# Patient Record
Sex: Female | Born: 1935 | Race: Black or African American | Hispanic: No | Marital: Single
Health system: Southern US, Community
[De-identification: ages and names within clinical notes are randomized; demographics above are authoritative.]

---

## 2004-02-26 ENCOUNTER — Emergency Department (HOSPITAL_COMMUNITY): Admission: EM | Admit: 2004-02-26 | Discharge: 2004-02-26 | Payer: Self-pay | Admitting: Emergency Medicine

## 2007-02-07 ENCOUNTER — Inpatient Hospital Stay (HOSPITAL_COMMUNITY): Admission: EM | Admit: 2007-02-07 | Discharge: 2007-02-15 | Payer: Self-pay | Admitting: Emergency Medicine

## 2007-02-10 ENCOUNTER — Ambulatory Visit: Payer: Self-pay | Admitting: Physical Medicine & Rehabilitation

## 2007-02-15 ENCOUNTER — Inpatient Hospital Stay (HOSPITAL_COMMUNITY)
Admission: RE | Admit: 2007-02-15 | Discharge: 2007-02-28 | Payer: Self-pay | Admitting: Physical Medicine & Rehabilitation

## 2008-02-18 ENCOUNTER — Emergency Department (HOSPITAL_COMMUNITY): Admission: EM | Admit: 2008-02-18 | Discharge: 2008-02-18 | Payer: Self-pay | Admitting: Emergency Medicine

## 2008-03-05 ENCOUNTER — Emergency Department (HOSPITAL_COMMUNITY): Admission: EM | Admit: 2008-03-05 | Discharge: 2008-03-05 | Payer: Self-pay | Admitting: Emergency Medicine

## 2009-02-20 ENCOUNTER — Inpatient Hospital Stay (HOSPITAL_COMMUNITY): Admission: EM | Admit: 2009-02-20 | Discharge: 2009-02-26 | Payer: Self-pay | Admitting: Internal Medicine

## 2009-02-20 ENCOUNTER — Encounter: Payer: Self-pay | Admitting: Emergency Medicine

## 2009-02-20 ENCOUNTER — Ambulatory Visit: Payer: Self-pay | Admitting: Diagnostic Radiology

## 2010-06-13 IMAGING — CR DG CHEST 2V
2 series · 2 of 2 positions shown · non-contrast
Comparison: None

CLINICAL DATA: Fever

CHEST - 2 VIEW

[w chest ap]
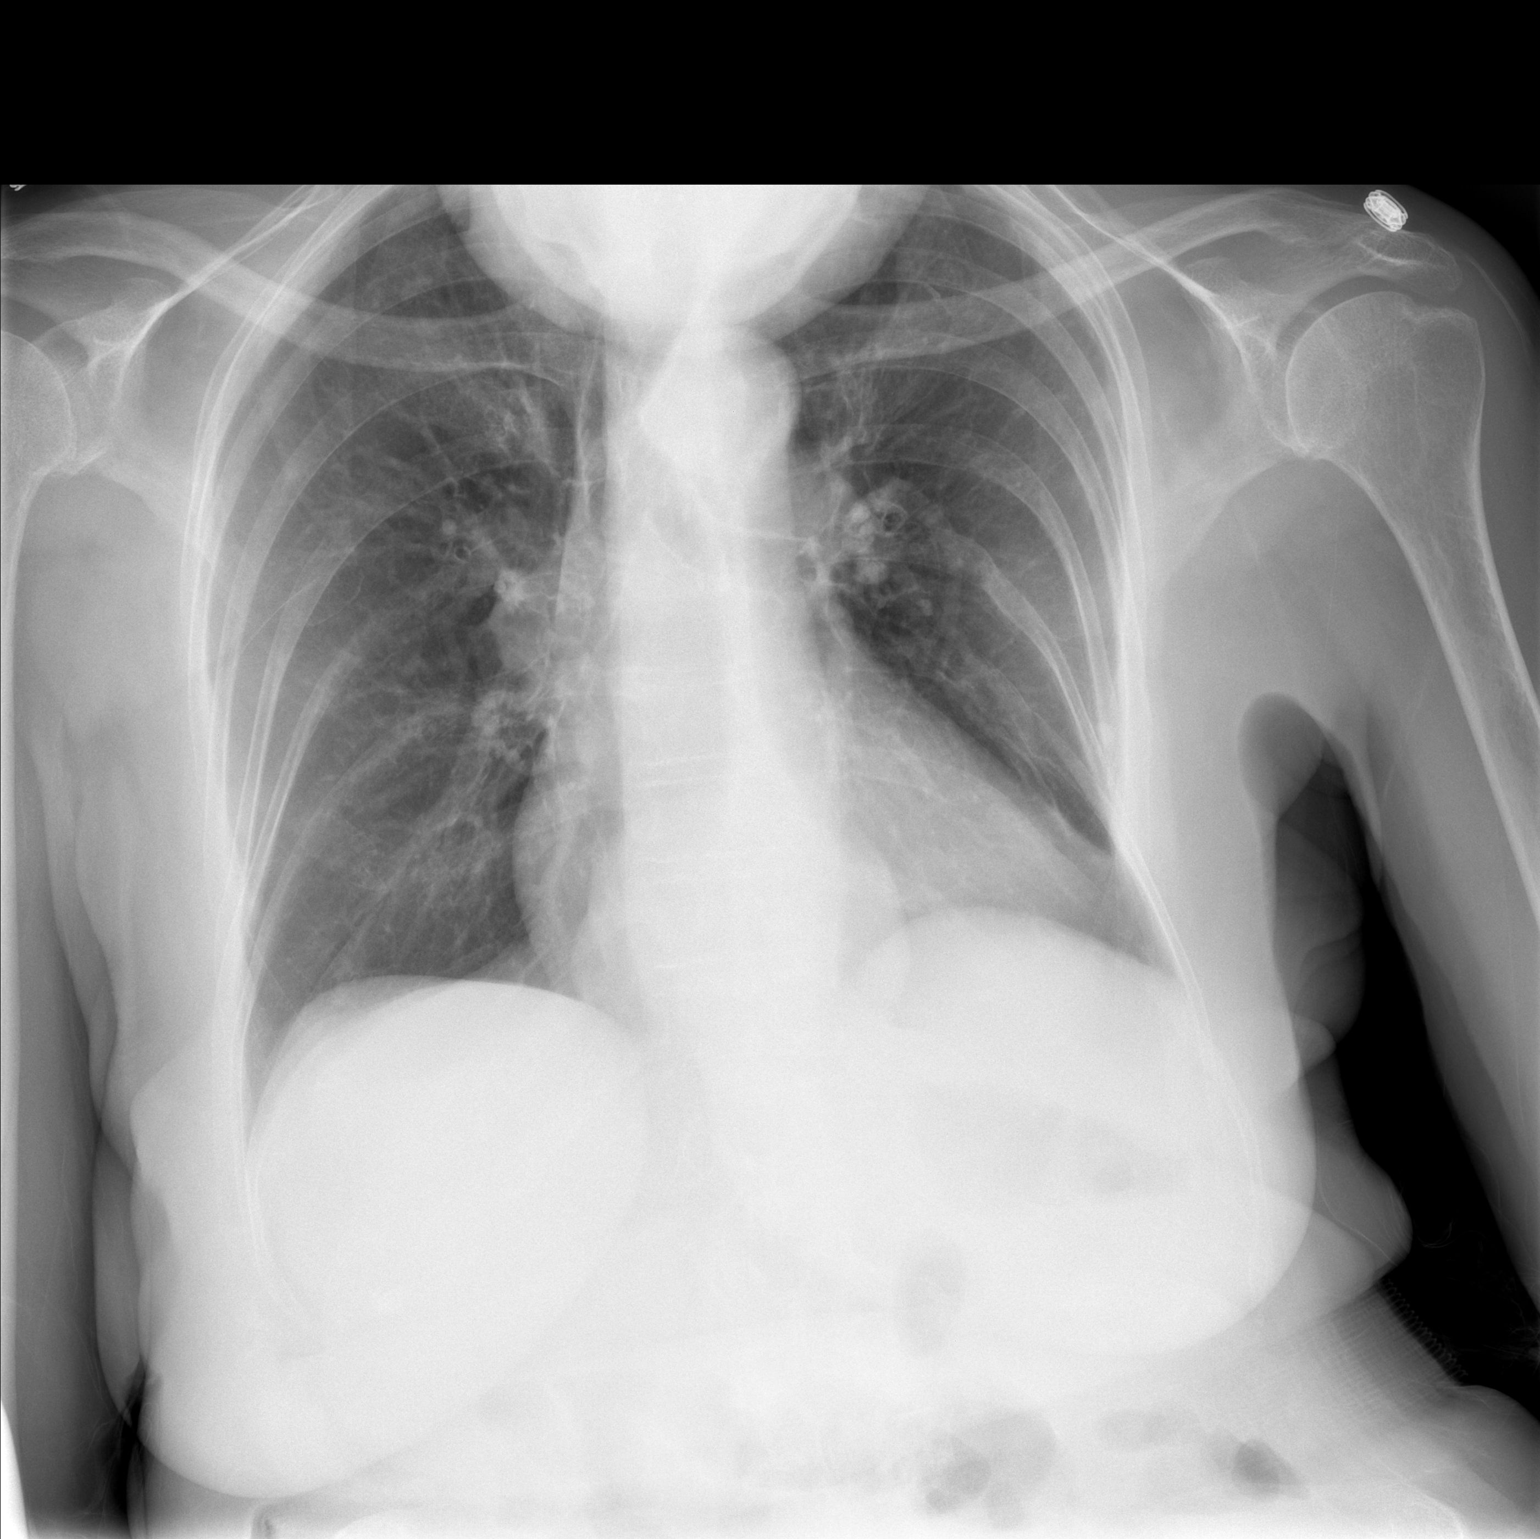

[w chest lat]
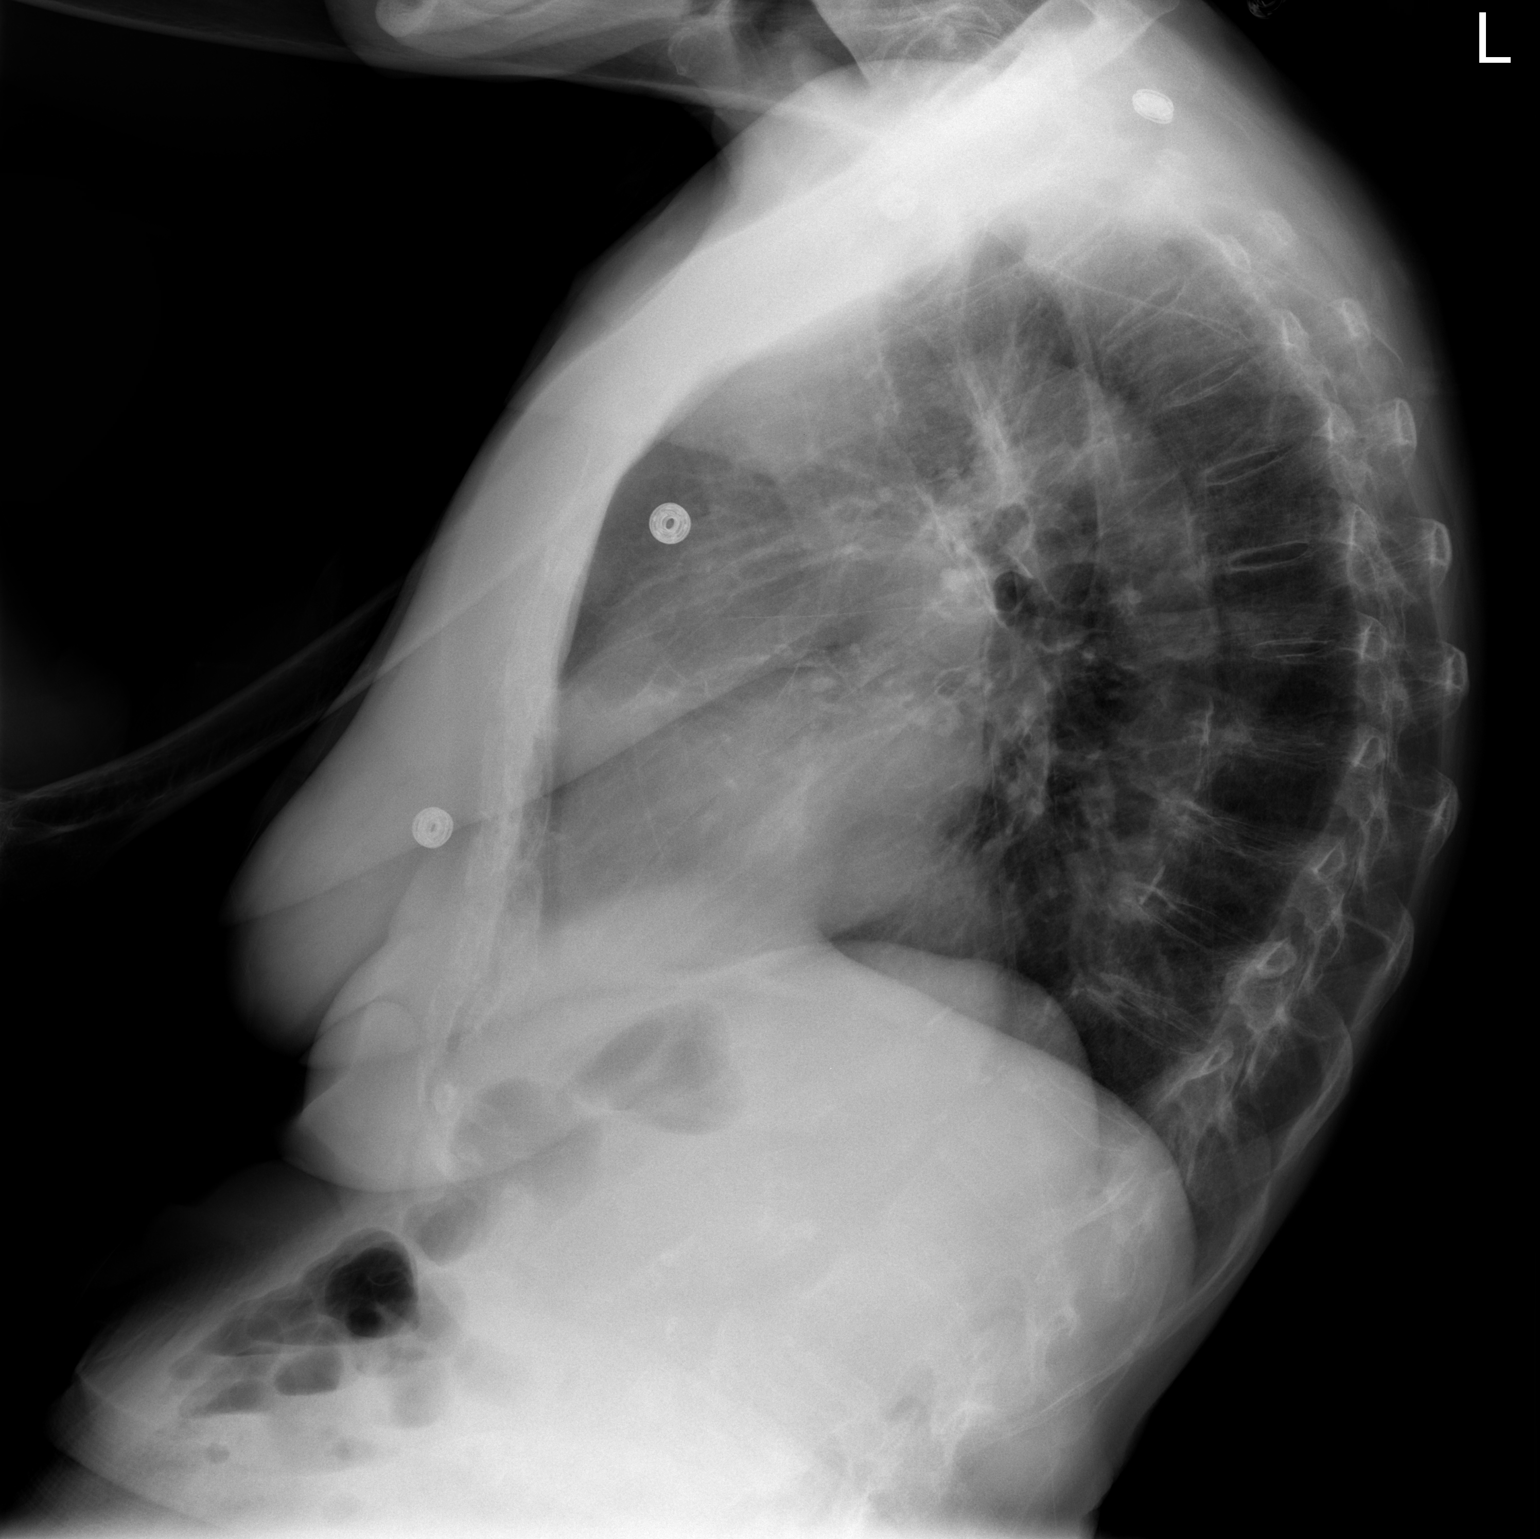

[2 of 2 positions shown; findings below may reference images not displayed]

FINDINGS: Heart size is normal.

There is no pleural effusion or pulmonary edema.

No airspace densities are identified.

A moderate size hiatal hernia is present.

Compression deformities are noted within the lumbar spine.  These
are age indeterminate
IMPRESSION: No active cardiopulmonary disease.

## 2011-03-25 LAB — CBC
HCT: 27.6 % — ABNORMAL LOW (ref 36.0–46.0)
HCT: 33.4 % — ABNORMAL LOW (ref 36.0–46.0)
MCHC: 33.2 g/dL (ref 30.0–36.0)
MCV: 82.8 fL (ref 78.0–100.0)
MCV: 83 fL (ref 78.0–100.0)
RBC: 3.34 MIL/uL — ABNORMAL LOW (ref 3.87–5.11)
RBC: 4.02 MIL/uL (ref 3.87–5.11)
WBC: 9.6 10*3/uL (ref 4.0–10.5)

## 2011-03-25 LAB — URINALYSIS, ROUTINE W REFLEX MICROSCOPIC
Glucose, UA: NEGATIVE mg/dL
Protein, ur: 100 mg/dL — AB
pH: 6 (ref 5.0–8.0)

## 2011-03-25 LAB — TSH: TSH: 1.191 u[IU]/mL (ref 0.350–4.500)

## 2011-03-25 LAB — BASIC METABOLIC PANEL
CO2: 22 mEq/L (ref 19–32)
Calcium: 7.8 mg/dL — ABNORMAL LOW (ref 8.4–10.5)
Calcium: 8.2 mg/dL — ABNORMAL LOW (ref 8.4–10.5)
Chloride: 104 mEq/L (ref 96–112)
Creatinine, Ser: 1.94 mg/dL — ABNORMAL HIGH (ref 0.4–1.2)
GFR calc Af Amer: 29 mL/min — ABNORMAL LOW (ref >60)
GFR calc Af Amer: 38 mL/min — ABNORMAL LOW (ref >60)
GFR calc Af Amer: 25 mL/min — ABNORMAL LOW (ref >60)
GFR calc non Af Amer: 24 mL/min — ABNORMAL LOW (ref >60)
GFR calc non Af Amer: 32 mL/min — ABNORMAL LOW (ref >60)
GFR calc non Af Amer: 21 mL/min — ABNORMAL LOW (ref >60)
Potassium: 4.2 mEq/L (ref 3.5–5.1)
Potassium: 3.9 mEq/L (ref 3.5–5.1)
Sodium: 133 mEq/L — ABNORMAL LOW (ref 135–145)
Sodium: 140 mEq/L (ref 135–145)
Sodium: 144 mEq/L (ref 135–145)

## 2011-03-25 LAB — DIFFERENTIAL
Eosinophils Absolute: 0 10*3/uL (ref 0.0–0.7)
Eosinophils Relative: 0 % (ref 0–5)
Lymphocytes Relative: 5 % — ABNORMAL LOW (ref 12–46)
Lymphs Abs: 0.5 10*3/uL — ABNORMAL LOW (ref 0.7–4.0)
Monocytes Relative: 7 % (ref 3–12)
Neutrophils Relative %: 88 % — ABNORMAL HIGH (ref 43–77)

## 2011-03-25 LAB — COMPREHENSIVE METABOLIC PANEL
AST: 38 U/L — ABNORMAL HIGH (ref 0–37)
BUN: 38 mg/dL — ABNORMAL HIGH (ref 6–23)
CO2: 25 mEq/L (ref 19–32)
Calcium: 8.2 mg/dL — ABNORMAL LOW (ref 8.4–10.5)
Creatinine, Ser: 2.21 mg/dL — ABNORMAL HIGH (ref 0.4–1.2)
GFR calc Af Amer: 25 mL/min — ABNORMAL LOW (ref >60)
GFR calc non Af Amer: 21 mL/min — ABNORMAL LOW (ref >60)

## 2011-03-25 LAB — URINE CULTURE

## 2011-03-25 LAB — CULTURE, BLOOD (ROUTINE X 2): Culture: NO GROWTH

## 2011-03-25 LAB — URINE MICROSCOPIC-ADD ON

## 2011-03-25 LAB — PREALBUMIN: Prealbumin: 8 mg/dL — ABNORMAL LOW (ref 18.0–45.0)

## 2011-04-27 NOTE — H&P (Signed)
Renee Neal, Renee Neal              ACCOUNT NO.:  [id number]   MEDICAL RECORD NO.:  [id number]          PATIENT TYPE:  INP   LOCATION:  1401                         FACILITY:  [facility]   PHYSICIAN:  Jackie Plum, M.D.DATE OF BIRTH:  February 11, 1919   DATE OF ADMISSION:  02/20/2009  DATE OF DISCHARGE:  02/26/2009                              HISTORY & PHYSICAL   ADMISSION DIAGNOSES:  1. Sepsis of urinary source.  2. Altered mental status change due to sepsis.  3. Acute renal failure.   DISCHARGE DIAGNOSES:  1. Sepsis, source urinary, with urinary tract infection.  2. Acute renal failure.  3. Electrolyte imbalance.  4. Altered mental status change due to sepsis and acute renal failure.  5. Encephalopathy due to metabolic and infectious etiology.   CHIEF COMPLAINT:  Altered mental status change.   HISTORY OF PRESENT ILLNESS:  Renee Neal is a very pleasant 77 year old  patient who lives with her niece and nephew-in-law.  She was brought to  the ED on account of mental status change.  Apparently, the patient had  been more confused with generalized weakness and drowsiness.  She is  normally able to ambulate with a walker and use the bathroom but could  not get off a toilet because of her generalized weakness.  At the  emergency room, the patient was seen by the ED physician, Dr. Bernette Mayers.  Workup revealed UTI on urinalysis with acute renal failure.  The patient  admitted for further management in this regard.   PAST MEDICAL HISTORY:  1. History of hypertension.  2. Hyperlipidemia.  3. Previous CVA.  4. Seizures.   CURRENT MEDICATIONS:  The patient's medicines were:  1. Amlodipine 5 mg daily.  2. Atenolol 50 mg daily.  3. Lasix 40 mg daily.  4. Vitamin D 50,000 international units weekly.  5. Keppra 500 mg b.i.d.  6. Pravastatin 20 mg q.h.s.  7. Calcium with vitamin D b.i.d.  8. Potassium chloride 20 mEq b.i.d.   ALLERGIES:  The patient is having no medication allergies.   FAMILY HISTORY:  Noncontributory in view of her advanced age.   PAST SURGICAL HISTORY:  No history of past surgeries.   REVIEW OF SYSTEMS:  The patient admits to chills with fever.  She also  admits to generalized weakness.  Otherwise, review of systems was  unremarkable on 10-point systemic inquiry.   PHYSICAL EXAMINATION:  VITAL SIGNS:  Blood pressure 144/85, pulse 78,  respirations 16, and temperature 102.2 degrees Fahrenheit.  GENERAL:  The patient is looking very weak.  She is alert but confused  and disoriented.  She is moving all her extremities on very simple  commands.  HEENT:  Normocephalic and atraumatic.  Pupils are equal, round, and  reactive to light.  Extraocular movements are intact.  Oropharynx is  moist.  NECK:  Supple.  No JVD.  LUNGS:  Clear to auscultation.  CARDIAC:  Regularly regular except for holosystolic murmur, otherwise  unremarkable.  ABDOMEN:  Bowel sounds are present.  EXTREMITIES:  No cyanosis but pitting edema noted.   LABORATORY DATA:  UA, color was yellow , specific gravity  1.014, pH 6.0,  negative glucose, large hemoglobin, bilirubin negative, ketones  negative, total protein 100, urobilinogen 1.0, nitrite positive, and  leukocyte esterase large.  Sodium 144, potassium 3.9, chloride 104, CO2  24, glucose 151, BUN 41, creatinine 2.2, calcium 9.1, and estimated  glomerular filtration rate for African-American was 25.  White count  9.6, hemoglobin 11.0, hematocrit 33.4, and platelet count 144,000.  X-  ray of the chest showed no acute cardiopulmonary process.  There was  moderate-size hiatal hernia noted.   ASSESSMENT AND PLAN:  This 77 year old lady presents with fever, chills,  altered mental status change, and generalized weakness.  The patient  admitted for sepsis due to urinary source with antibiotics and  supportive care.  hypotension  due to her sepsis.  Supportive care,  continue rehabilitation      Jackie Plum, M.D.   Electronically Signed     GO/MEDQ  D:  02/26/2009  T:  02/27/2009  Job:  [id number]

## 2011-04-30 NOTE — H&P (Signed)
Renee Neal, Renee Neal              ACCOUNT NO.:  [id number]   MEDICAL RECORD NO.:  [id number]          PATIENT TYPE:  IPS   LOCATION:  4028                         FACILITY:  [facility]   PHYSICIAN:  Ellwood Dense, M.D.   DATE OF BIRTH:  [birth year]   DATE OF ADMISSION:  02/15/2007  DATE OF DISCHARGE:                              HISTORY & PHYSICAL   PRIMARY CARE PHYSICIAN:  Dr. Celine Mans (out of town).   NEUROLOGIST:  Dr. Sharene Skeans.   HISTORY OF THE PRESENT ILLNESS:  Renee Neal is an 77 year old adult  female with history of hypertension and prior stroke with recent falls  x3.  She was admitted February 06, 2007, after family noted what  appeared to be seizure activity; and she was found to have left focal  motor seizure.   Head CT showed a 1.5 x 4.8-cm subdural hematoma which was questionably  chronic with mild local mass effect with atrophy and old right frontal  infarct noted.  She was treated with Dilantin and started on Keppra for  seizure prophylaxis.  Neurology was following the patient and felt that  she had subacute subdural hematoma secondary to recent falls with  organic brain syndrome and gait disorder with new-onset seizures.  Followup cranial CT February 13, 2007, showed no significant change in fluid  collection or high-attenuation density at the right tentorial area.  EEG  showed diffuse and focal right-sided fronto-tentorial slowing with  evidence of intermittent sharp waves and low seizure threshold.   The patient has had balance problems and is noted to be leaning  substantially to the left, even in the lying position.  She was noted to  have recent fevers to 101 last p.m. and was started on 1 dose of Septra  for a probable UTI with urine cultures growing 10 to the 5th E coli.  Foley was discontinued yesterday.  She still has persistent fevers.  Sensitivities are pending on that urine culture.   The patient was evaluated by the rehabilitation physicians and felt to  be an  appropriate candidate for inpatient rehabilitation.   REVIEW OF SYSTEMS:  Positive for incontinence with urgency and weakness  of the left side.   PAST MEDICAL HISTORY:  1. Prior stroke of the right anterior cerebral artery distribution.  2. Hypertension.  3. Dyslipidemia.  4. Bilateral iridectomies.   FAMILY HISTORY:  Noncontributory secondary to advanced age on the  patient's part.   SOCIAL HISTORY:  The patient normally lives with her sister in  [place].  She has been up visiting family in [place], [place], since approximately December 2007 when these symptoms started.  The plan is for the patient to return to the family's home in [place]  with family members caring for her and then eventually return to  [place] if possible with her sister.  The patient does not use  alcohol or tobacco.   FUNCTIONAL HISTORY PRIOR TO ADMISSION:  Used a walker prior to admission  and does not drive.  She was independent for ambulation and self-cares  prior to this admission.   ALLERGIES:  No known drug allergies.  MEDICATIONS PRIOR TO ADMISSION:  1. Norvasc 5 mg p.o. daily.  2. Aspirin 81 mg p.o. daily.  3. Tenormin 50 mg p.o. daily.  4. Lasix 40 mg p.o. daily.  5. Pravastatin 20 mg p.o. q.h.s.   LABORATORY:  Recent hemoglobin was 10.8, hematocrit of 32.4, platelet  count of 212,000, and white count of 5.1.  A recent urinalysis showed  many bacteria with too numerous to count white blood cells, and urine  culture grew out 10 to the 5th E coli with sensitivities pending.  Recent sodium was 142, potassium 4.1, chloride 105, CO2 32, BUN 12,  creatinine 0.8, and glucose 91.   PHYSICAL EXAMINATION:  GENERAL:  Well-appearing elderly female lying in  bed in no acute discomfort.  VITAL SIGNS:  Blood pressure 110/58 with a pulse of 80, respiratory rate  18, and temperature 100.3.  HEENT:  Normocephalic, nontraumatic.  CARDIOVASCULAR:  Regular rate and rhythm.  S1, S2  without murmurs.  ABDOMEN:  Soft, nontender with positive bowel sounds.  LUNGS:  Clear to auscultation bilaterally.  NEUROLOGICAL:  Alert and oriented x3 with only occasional cuing needed.  She does have a significant lean to the left, even in the supine  position.  Right upper and right lower extremity strength were generally  4+/5 throughout.  Bulk and tone were normal, and reflexes were 2+ and  symmetrical.  She has slight difficulty following complex commands.  Left upper extremity strength was 4-/5, and left lower extremity  strength was 3+ to 4-/5.  Sensation was intact to light touch throughout  the bilateral upper and lower extremities.   IMPRESSION:  1. Status-post acute/subacute right frontal subdural hematoma      secondary to recent falls.  2. New-onset seizure disorder related to #1.  3. Hypertension.  4. Dyslipidemia.   Presently, the patient has deficits in ADLs, transfers, and ambulation  along with higher level cognition secondary to the above-noted right  frontal subdural hematoma.   PLAN:  1. Admit to the rehabilitation unit for daily physical therapy for      range of motion, strengthening, bed mobility, transfers, pre-gait      training, gait training, and equipment eval.  2. Occupational therapy for range of motion, strengthening, ADLs,      cognitive/perceptual training, splinting and equipment eval.  3. Rehab nursing for skin care, wound care, and bowel and bladder      training.  4. Speech therapy for oral motor exercises and higher level cognitive      eval.  5. Case management to assess home environment, assist with discharge      planning, and arrange for appropriate followup care.  6. Social worker to assess family and social support, counsel the      patient regarding disability issues, and assist in discharge      planning.  7. Monitor hypertension on Tenormin 50 mg p.o. daily and Norvasc 5 mg      p.o. daily. 8. Continue Zocor 10 mg p.o. q.h.s.  for dyslipidemia.  9. Continue Keppra 500 mg p.o. b.i.d. for seizure prophylaxis.  10.Discontinue Bactrim and start Cipro 250 mg p.o. b.i.d. and check      final urine sensitivities.  11.No Lovenox for DVT prophylaxis secondary to subdural hematoma.  12.Check admission labs including CBC and CMET Thursday, February 16, 2007.  13.Regular diet.   PROGNOSIS:  Good.   ESTIMATED LENGTH OF STAY:  Ten to 15 days.   GOALS:  Modified  independent to standby-assist ADLs, transfers, and  ambulation.           ______________________________  Ellwood Dense, M.D.     DC/MEDQ  D:  02/15/2007  T:  02/15/2007  Job:  [id number]

## 2011-04-30 NOTE — Discharge Summary (Signed)
NAMEPRISCILA, Renee Neal              ACCOUNT NO.:  [id number]   MEDICAL RECORD NO.:  [id number]          PATIENT TYPE:  IPS   LOCATION:  4028                         FACILITY:  [facility]   PHYSICIAN:  Ellwood Dense, M.D.   DATE OF BIRTH:  Jun 13, 1919   DATE OF ADMISSION:  02/15/2007  DATE OF DISCHARGE:  02/28/2007                               DISCHARGE SUMMARY   DISCHARGE DIAGNOSES:  1. Right subacute frontal subdural hemorrhage.  2. Escherichia coli urinary tract infection.  3. Seizure onset.  4. Dyslipidemia.   HISTORY OF PRESENT ILLNESS:  Renee Neal is an 77 year old female with  history of hypertension, prior CVA, recent fall x3.  Admitted to [facility] February 25, 208, with left focal motor seizure and CT of  head revealing 1.5 x 4.8 subdural hemorrhage, question chronic, with  mild mass effect.  She was initially treated with Dilantin and started  on Keppra for seizure prophylaxis by Dr. Sharene Skeans.  Neurology felt  patient with subacute subdural hemorrhage secondary to recent fall due  to OBS and gait disorder, and new-onset seizure.  Followup CCT on March  3 showed no significant change.  EEG done showed diffuse and focal right-  sided frontal temporal slowing with evidence of intermittent sharp waves  suggestive of low seizure threshold.  The patient is currently noted to  continue with balance problems as well as weakness, was noted to have  fevers with T-max of 101 in the last 24 hours and urine culture being  positive for greater than 100,000 colony E. coli.  Foley discontinued  and the patient has been started on Cipro.  Secondary to impairments in  mobility and self-care, rehab consulted for further therapies.   PAST MEDICAL HISTORY:  Significant for right ACA infarct, hypertension,  dyslipidemia and bilateral iridectomies.   ALLERGIES:  No known drug allergies.   FAMILY HISTORY:  Noncontributory secondary to age.   SOCIAL HISTORY:  The patient lives in  [place] with sister,  currently visiting family in [place].  House in [place] split-level  home with three steps at entry.  The patient does not use any alcohol or  tobacco, is a retired [employer].  Used a walker for ambulation  prior to admission.   HOSPITAL COURSE:  Renee Neal was admitted to rehab on February 15, 2007, for inpatient therapies to consist of PT and OT daily.  At time of  admission the patient was noted be febrile with temperature at 103.  She  was started on antibiotics which changed over to p.o. Cipro and she was  pancultured.  As the patient continued with fever on the a.m. of March  6, neurology felt the patient would be better served on IV antibiotics  and she was changed to IV antibiotics.  Full workup done includes chest  x-ray showing no focal disease, blood cultures x2 drawn showed no  growth.  Check of labs revealed hemoglobin 10.4, hematocrit 30.5, white  count 5.5, platelets 149.  Check of electrolytes revealed sodium 135,  potassium 3.9, chloride 102, CO2 26, BUN 22, creatinine 1.26, glucose  106.  Of note, on chest x-ray there was a small nodule in peripheral  right upper lung.  Recommendations are for interval chest x-ray to  further evaluate in the future.  The patient defervesced nicely on  Cipro.  She was treated with a 7-day course of antibiotics.  The patient  has been continent of bowel and bladder with toileting.   Therapies were initiated and the patient was noted to be cognitively  intact in her comprehension and expression.  Reading comprehension as  well as writing expression was within functional limits.  She was able  to sustain attention in moderately distractive environment for about 30  minutes.  She showed good awareness as well as was able to solve basic  problems, and able to answer questions regarding complex problems with  reasoning intact.  The patient is noted to have problems with posture  secondary to  kyphosis.  She has demonstrated increase in standing  balance.  She does require maximum verbal cues to lean forward with  standing.  She is at minimum assist to supervision for self-care needs.  In terms of mobility, she is at supervision level for ambulating with  increased time, requires occasional minimum assist.  She is moderate to  maximum assist for car transfers.  Able to propel wheelchair at  supervision level.  The family feels the patient is back to baseline in  terms of her mobility.  Initially was set for longer length of stay.  However, as the patient currently at baseline the family felt that they  would prefer to continue on with home therapies.  Family education was  completed and the patient to continue receiving further followup home  health PT/OT by [facility] past discharge.  On February 28, 2007,  the patient is discharged to home with 24-hour supervision assistance by  family.   DISCHARGE MEDICATIONS:  1. Keppra 500 mg p.o. b.i.d.  2. Norvasc 5 mg per day.  3. Atenolol 50 mg a day.  4. Pravastatin 20 mg q.p.m.   DIET:  Soft.   ACTIVITIES:  As tolerated with 24-hour supervision, use wheelchair, walk  with supervision.   SPECIAL INSTRUCTIONS:  Should not use Lasix, aspirin or aspirin-  containing products.   FOLLOWUP:  1. The patient to follow up with Dr. Concepcion Elk for appointment March 08, 2007, at 10:30 with repeat chest x-ray to be done in future.  2. Follow up with Dr. Sharene Skeans for routine check in 4 weeks.  3. Follow up with Dr. Lamar Benes as needed.      Greg Cutter, P.A.    ______________________________  Ellwood Dense, M.D.    PP/MEDQ  D:  02/28/2007  T:  03/01/2007  Job:  [id number]   cc:   Fleet Contras, M.D.  Deanna Artis. Sharene Skeans, M.D.

## 2011-04-30 NOTE — Discharge Summary (Signed)
Renee Neal, Renee Neal              ACCOUNT NO.:  [id number]   MEDICAL RECORD NO.:  [id number]          PATIENT TYPE:  INP   LOCATION:  1323                         FACILITY:  [facility]   PHYSICIAN:  Deanna Artis. Hickling, M.D.DATE OF BIRTH:  10/10/1919   DATE OF ADMISSION:  02/07/2007  DATE OF DISCHARGE:  02/15/2007                               DISCHARGE SUMMARY   FINAL DIAGNOSES:  1. Subacute right frontal subdural hematoma.  2. Remote right brain subcortical stroke.  3. New onset of partial seizures with secondary generalization.  4. Catheter induced urinary tract infection.   PROCEDURES:  1. CT scan of the brain x3.  2. EEG.   COMPLICATIONS:  None.   HOSPITAL COURSE:  The patient is an 77 year old woman who presented to  the [facility] emergency room with sudden onset of a 7 minute left  focal motor seizure. She had evidence of remote right branch stroke and  a subacute right frontal subdural overlying it. The history is that the  patient has had 3 falls in the past couple of weeks, 2 of them within  the same week, all were witnessed. The patient did not have memory for  this and it was thought that these might represent seizures. I think  that she fell and created her subdural at that time.   The patient was hospitalized and it was very clear from the outset that  she had significant weakness in her left lower extremity, this has not  improved. The patient has great difficulty moving about in the bed,  moving her body to the bedside, pushing up to stand and has problems  with balance when she walks.   She has been seen by physical therapy and also rehabilitation and is  judged appropriate for comprehensive inpatient rehabilitation. The  family has refused for her to go to a skilled nursing facility.   The physical therapist noted that the patient had 2 episodes of loss of  balance falling behind herself and fortunately was backed up by the  therapist. She also is unable to  stand and pull up and down her  underpants without letting go of the walker and when she attempted to do  so she fell backwards.   The patient has a walker that is 75 years old and a wheelchair that is 75  years old, both of which are malfunctioning and will need to be  replaced.   CURRENT MEDICATIONS:  1. Aspirin 81 mg a day.  2. Tenormin 50 mg a day.  3. Lasix 40 mg a day.  4. Zocor 10 mg a day.  5. Keppra 500 mg twice daily.  6. Norvasc 5 mg daily.  7. Triamcinolone 0.1% cream 3 times a day applied to her legs.  8. Trimethoprim sulfate DS 1 tablet twice daily.   PRN MEDICINES:  Tylenol and Senokot.   The patient has on TED hose for DVT prophylaxis. She was not able to  tolerate PAS hose developing a rash on her leg. This rash has largely  cleared up.   The patient had her Foley catheter removed yesterday because  of a  urinary tract infection which is being treated. The urinalysis still is  pending at this time. She is doing fairly well with the bedside commode  and having difficulty urinating into the bedpan.   PHYSICAL EXAMINATION:  GENERAL:  The patient tells me that she did not  sleep well last night. She says that she has had some problems with  urination.  VITAL SIGNS:  Blood pressure 99/51, resting pulse 81, respirations 20,  temperature this morning 101.3. She began to have an elevated  temperature last night at 5:40 p.m. and it peaked at 102.2 at 2:50.  Oxygen saturation 97%.  HEENT:  No signs of infection, supple neck, full range of motion, no  cranial or cervical bruits.  LUNGS:  Clear.  HEART:  No murmurs.  ABDOMEN:  Soft, nontender.  EXTREMITIES:  Well formed.  NEUROLOGIC:  Mental status:  The patient was awake and alert, recognizes  me, is oriented, no dysphasia. Cranial nerves:  Round reactive pupils,  visual fields full, extraocular movement full and conjugate. Symmetric  facial strength, midline tongue and uvula. Air conduction greater than  bone  conduction. Motor examination:  Normal strength in her upper  extremities. Fairly good fine motor movements, no drift. In the lower  extremities, she has slight give away in her knee flexors on the right  side for the first time. She has otherwise very good strength in the  right leg. In the left leg she is barely 3/5 in the hip flexors, 3/5 in  the psoas, 4-/5 knee flexors and extensors 5/5, foot dorsiflexors 3/5.   With the elevation in temperature, we will watch that carefully. She is  being treated now with Septra DS and we may need to go to parenteral  treatment with IV medication depending upon the result of the urine  culture which is greater than 100,000 E. coli.   Her other laboratory studies are as follows:  Urinalysis specific  gravity 1.020, pH 2, small blood, protein 100, nitrate positive,  leukocyte esterase large, white blood cells too numerous to count,  bacteria many. Cardiac markers, CK-MB 2.9, troponin less than 0.05,  myoglobin 89.9. Previous urinalysis specific gravity of 1.010, pH 7.5,  negative nitrites and leukocytes. Comprehensive metabolic panel, sodium  142, potassium 4.1, chloride 105, CO2 32, glucose 91, BUN 12, creatinine  0.8. Total bilirubin 0.6, alkaline phosphatase 82, AST 18, ALT 11, total  protein 7.1. Albumin 3.7, calcium 9.6. CBC with differential, white  count 5100, hemoglobin 10.8, hematocrit 32.4, MCV 81.6, platelet count  212,000, 51 polys, 27 lymphs, 10 monos, 11 eosinophils, 1 basophil.  Absolute neutrophil count 2600. It has not been repeated since she  developed her urinary tract infection. The patient is in stable  condition concerning her nervous system. Urinary tract infection is  creating problems with her fever and it seems to have peaked last night  and hopefully will continue to decline during the day and if not, we  will need to become more aggressive with her care. She is ready for comprehensive inpatient rehabilitation. If a bed is  not available, then  we may need to transfer her to home because the family will not accept  skilled nursing facility.      Deanna Artis. Sharene Skeans, M.D.  Electronically Signed     [facility]/MEDQ  D:  02/15/2007  T:  02/15/2007  Job:  [id number]

## 2011-04-30 NOTE — Discharge Summary (Signed)
NAMEMISKI, FELDPAUSCH              ACCOUNT NO.:  [id number]   MEDICAL RECORD NO.:  [id number]          PATIENT TYPE:  INP   LOCATION:  1323                         FACILITY:  [facility]   PHYSICIAN:  Deanna Artis. Hickling, M.D.DATE OF BIRTH:  08-11-1919   DATE OF ADMISSION:  02/06/2007  DATE OF DISCHARGE:                               DISCHARGE SUMMARY   Audio too short to transcribe (less than 5 seconds)      Deanna Artis. Sharene Skeans, M.D.     [facility]  D:  02/15/2007  T:  02/15/2007  Job:  [id number]

## 2011-04-30 NOTE — Procedures (Signed)
EEG NUMBER:   CLINICAL INFORMATION:  This is a portable EEG on a patient with a  history of seizures.  This is an 77 year old patient who has been  falling and not remembering the falls.   TECHNICAL DESCRIPTION:  This EEG initially was recorded during the stage  II sleep states.  Subsequently, however, the patient did awaken and had  well-formed alpha activity.  There was, however, a persistent asymmetry  throughout this EEG with slowing into the theta and at times delta range  located in the right hemisphere primarily in the frontotemporal region.  This was associated with intermittent sharp wave activity in the right  frontal region as best seen in page 115 and 116 and 117 indicative of  lower threshold for seizure.   IMPRESSION:  This an abnormal EEG showing evidence of diffuse and focal  right-sided frontotemporal slowing with evidence of intermittent sharp  waves in the right frontotemporal region indicative of a lower threshold  for seizure activity.  These findings should be taken into account the  clinical state of the patient and suggests both a destructive and  irritated lesion in the right brain.           ______________________________  Genene Churn. Sandria Manly, M.D.     LKG:MWNU  D:  02/08/2007 18:30:53  T:  02/08/2007 23:52:46  Job #:  [id number]

## 2011-04-30 NOTE — Discharge Summary (Signed)
NAMESARIA, HARAN              ACCOUNT NO.:  [id number]   MEDICAL RECORD NO.:  [id number]          PATIENT TYPE:  INP   LOCATION:  1401                         FACILITY:  [facility]   PHYSICIAN:  Jackie Plum, M.D.DATE OF BIRTH:  1919-06-26   DATE OF ADMISSION:  02/20/2009  DATE OF DISCHARGE:  02/26/2009                               DISCHARGE SUMMARY   DISCHARGE DIAGNOSES:  1. Sepsis of urinary source.  2. Altered mental status secondary to sepsis, improved.  3. Acute renal failure due to sepsis, improving.  4. Thrombocytopenia due to sepsis, improved.  5. History of hypertension.  6. Cerebrovascular accident.  7. Dyslipidemia.  8. Seizure disorder.  9. Failure to thrive.   For full details regarding the patient's presentation, please review H  and P dictated by me for date of service February 20, 2009.  The patient is  an 77 year old African-American lady with history of multiple medical  problems who presented to [facility] ED with confusion and decreased  mental status.  She was hypotensive, dehydrated, and noted to have  urinary infection on UA.  Her lungs were clear to auscultation.  She had  fever and leukocytosis.  The patient was admitted to the hospital for  further management.  On admission, the patient was started on IV fluids  for dehydration, antibiotics for sepsis of urinary source, and  supportive care.  Her renal function improved after her fevers resolved.  She was seen by [facility] and was felt appropriate for home  discharge.  In view of her multiple medical problems, it was felt  appropriate for patient to be transferred to [facility] hospital for onward  care.   Discharged medications:  Rocephin 1 g 2 daily, Norvasc 5 mg daily,  atenolol 50 mg daily, Lasix 40 mg daily, pravastatin 20 mg daily, and  Keppra 500 mg 2 capsules b.i.d.  The patient was discharged in stable  and satisfactory condition for onward care for her deconditioned status.      Jackie Plum, M.D.  Electronically Signed     GO/MEDQ  D:  04/23/2009  T:  04/24/2009  Job:  [id number]

## 2011-04-30 NOTE — H&P (Signed)
Renee Neal, Renee Neal              ACCOUNT NO.:  [id number]   MEDICAL RECORD NO.:  [id number]          PATIENT TYPE:  EMS   LOCATION:  ED                           FACILITY:  [facility]   PHYSICIAN:  Deanna Artis. Hickling, M.D.DATE OF BIRTH:  May 11, 1919   DATE OF ADMISSION:  02/06/2007  DATE OF DISCHARGE:                              HISTORY & PHYSICAL   CHIEF COMPLAINT:  Seizures.   HISTORY OF PRESENT ILLNESS:  77 year old right-handed African American  woman, single, who suffered a 5 to 7-minute left focal motor seizure at  2:30 p.m.  She presented to the [facility] emergency room at 18:54.  A  call was placed to me at 22:20 to evaluate the patient.  The patient is  admitted for observation and to treat her seizure.  The patient has a  past history of a right subcortical infarction about 13 years ago that  caused significant atrophy of the right frontal plane and hydrocephalous  ex vacuo of the right frontal horn.  This extended out toward the  cortical surface with some localized atrophy in the cortex.  The  etiology of this was unknown.  The patient was hospitalized for a couple  of weeks and then in rehab for some time longer.   The patient has had three falls in the last couple of weeks.  All three  were witnessed.  In two of them she seemed to be confused and has no  recollection for them and one she remembers tripping.  The patient has  had unsteadiness on her feet since she had her stroke and needs a walker  to get around.  She was using walk on all three occasions when she fell.   Tonight's episode was the first that was witnessed by her son-in-law.  He was home with her contacted the patient's niece who was at work.  She  came home and observed the patient and decided to go back home to finish  up work and bring the patient in to be evaluated for her seizure.   PAST MEDICAL HISTORY:  1. Bilateral cataracts.  2. Dyslipidemia.  3. Hypertension.   PAST SURGICAL HISTORY:   Bilateral iridectomies.  I am unaware of any  other surgical procedures.   CURRENT MEDICATIONS:  1. Amlodipine 5 mg twice daily.  2. Aspirin 81 mg daily.  3. Atenolol 50 mg daily.  4. Furosemide 40 mg daily.  5. Pravastatin 20 mg at bedtime.   ALLERGIES:  NONE KNOWN.   REVIEW OF SYSTEMS:  Remarkable for an respiratory infection and her  falls that were witnessed.  The patient has not had any other  significant medical problems.   SOCIAL HISTORY:  The patient lives as in [place] with her sister  who is the mother of the woman who is in the room with her.  The  patient's niece is married, and she and her husband take care of the two  elderly women during the wintertime, and they go to [place] during  the summer.  The patient does not use tobacco.  She does not drink  alcohol.  She  does not take recreational drugs.   FAMILY HISTORY:  The family history is unknown.  The patient thinks that  her father may have had a stroke but does not know.  She says her mother  just gradually got weaker and died of old age.   PHYSICAL EXAMINATION:  VITAL SIGNS:  Temperature 98.4, blood pressure  142/71, resting pulse 69, respirations 20, oxygen saturation 100.  GENERAL:  The patient seems to have a mild torticollis with her head  turned to the left, but she has no bruising to her head, and I can bring  her head back to neutral position.  NECK:  She has no bruits.  Her neck is supple.  LUNGS:  Clear.  BREASTS:  Pendulous.  HEART:  No murmurs.  Pulses normal.  ABDOMEN:  Soft.  Bowel sounds normal.  No hepatosplenomegaly.  GU: The patient has a Foley.  EXTREMITIES:  Normal.  The patient has a tight left heel cord and some  increased tone in the left arm.  SKIN:  Venostasis changes in both feet up to the upper ankles.  NEUROLOGIC:  Mental status:  The patient was awake and alert.  She is  able to name the date, month, and year, the season, the president.  Her  immediate memory is good.   Her intermediate memory is not so good.  The  patient knows who the president is, knows who the democratic  presidential candidates are and spoke about that.  Interestingly,  however, she did not remember that she had bilateral iridectomies and  denied it.   Cranial nerves:  The patient has regular pupils.  The left is pinpoint.  I cannot see in the fundus very well in either eye.  Visual fields are  full to double simultaneous stimuli.  Extraocular movements are full and  conjugate.  Symmetric facial strength.  Midline tongue and uvula.  Air  conduction greater than bone conduction.  I think she does have some  decreased acuity.   Motor examination:  The patient has quite good strength in both upper  extremities.  She has clumsiness in her fine motor movements on the left  side, a slight amount of drift, and some clumsiness on finger-to-nose in  the left arm.  In the legs, she has 4/5 strength in her right hip  flexors, 5/5 in the psoas, knee flexors and extensors, foot dorsiflexors  and plantar flexors.  On the left side, she is barely 3/5 in the hip  flexors, 4/5 in the psoas, 45 in the knee flexors and extensors.  Foot  dorsiflexors are 1/5, plantar flexors are 4/5.   Sensation:  There is no hemisensory.  She has good stereognosis.  She  has peripheral polyneuropathy that is in the stocking distributions of  the upper calf.   Cerebellar examination:  Good finger-to-nose on the right side.  The  left side shows a bit more incoordination which I think may be motor  more so than sensory.  Gait is not tested.  Deep tendon reflexes were  absent at the ankles, diminished at the knees, biceps, triceps, and  brachial radialis.  The patient did not have significant response to  plantar stimulation.  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   IMPRESSION:  1. 47.5 x 14.5-mm subacute subdural in the right frontal region with      local mass effect. 2. Remote right subcortical frontal  infarction with hydrocephalous ex      vacuo, diffuse atrophy, and  white matter changes.  3. New onset left focal motor seizures, likely secondary to the right      subdural hematoma.   The films have been seen by Dr. Aliene Beams.  He felt that the patient  did not need surgical intervention, and I agree at this time.   PLAN:  We are going to continue her medications.  I considered  withholding the aspirin but will discuss this with my partners because  it is a rather small dose, and discontinuing it will place her at risk  of having a nonhemorrhagic stroke.   Will place the patient on Dilantin tonight but switch her over to Keppra  tomorrow.  I have discussed the benefits and burdens, and her daughter  is clearly in favor of the Keppra but Dilantin had already been ordered  and hung.   LABORATORY DATA:  I have reviewed her laboratory studies.  White count  5100, hemoglobin 10.8, hematocrit 32.4, MCV 81.6, platelet count  212,000, 51 polys, absolute granulocytes 2600, 27 lymphs, 10 monos, 11  eosinophils.  Urinalysis:  Specific gravity 1.010, pH 7.5.  Chemistries  were negative.  Cardiac markers were negative.  Comprehensive metabolic  panel:  Sodium 142, potassium 4.1, chloride 105, CO2 32, glucose 81, BUN  12, creatinine 0.8, calcium 9.6, total protein 7.1, albumin 3.7, AST 18,  ALT 11, and alkaline phosphatase is pending.      Deanna Artis. Sharene Skeans, M.D.  Electronically Signed     WHH/MEDQ  D:  02/06/2007  T:  02/07/2007  Job:  [id number]   cc:   Reinaldo Meeker, M.D.

## 2011-09-06 LAB — CBC
HCT: 27.8 — ABNORMAL LOW
Hemoglobin: 9.5 — ABNORMAL LOW
MCHC: 34.1
MCV: 80.2
RBC: 3.46 — ABNORMAL LOW
RDW: 12.5

## 2011-09-06 LAB — DIFFERENTIAL
Basophils Absolute: 0
Basophils Relative: 0
Eosinophils Absolute: 0.3
Eosinophils Relative: 7 — ABNORMAL HIGH
Monocytes Absolute: 0.4
Monocytes Relative: 11
Neutro Abs: 2.4

## 2011-09-06 LAB — BASIC METABOLIC PANEL
CO2: 29
Chloride: 104
GFR calc Af Amer: 46 — ABNORMAL LOW
Glucose, Bld: 99
Potassium: 4.3
Sodium: 141

## 2014-02-27 ENCOUNTER — Telehealth: Payer: Self-pay | Admitting: *Deleted

## 2014-02-28 NOTE — Telephone Encounter (Signed)
Patient chart was entered in error.

## 2014-09-12 DEATH — deceased
